# Patient Record
Sex: Female | Born: 1975 | Race: Black or African American | Hispanic: No | Marital: Single | Smoking: Never smoker
Health system: Southern US, Community
[De-identification: ages and names within clinical notes are randomized; demographics above are authoritative.]

## PROBLEM LIST (undated history)

## (undated) DIAGNOSIS — R200 Anesthesia of skin: Secondary | ICD-10-CM

## (undated) DIAGNOSIS — R202 Paresthesia of skin: Secondary | ICD-10-CM

## (undated) DIAGNOSIS — R000 Tachycardia, unspecified: Secondary | ICD-10-CM

## (undated) DIAGNOSIS — G43909 Migraine, unspecified, not intractable, without status migrainosus: Secondary | ICD-10-CM

## (undated) HISTORY — DX: Anesthesia of skin: R20.2

## (undated) HISTORY — DX: Migraine, unspecified, not intractable, without status migrainosus: G43.909

## (undated) HISTORY — DX: Anesthesia of skin: R20.0

## (undated) HISTORY — PX: ABDOMINAL HYSTERECTOMY: SHX81

---

## 2014-03-31 ENCOUNTER — Encounter (HOSPITAL_COMMUNITY): Payer: Self-pay | Admitting: Emergency Medicine

## 2014-03-31 ENCOUNTER — Emergency Department (HOSPITAL_COMMUNITY): Payer: 59

## 2014-03-31 ENCOUNTER — Emergency Department (HOSPITAL_COMMUNITY)
Admission: EM | Admit: 2014-03-31 | Discharge: 2014-04-01 | Disposition: A | Discharge status: Home / Self Care | Payer: 59 | Attending: Emergency Medicine | Admitting: Emergency Medicine

## 2014-03-31 DIAGNOSIS — Y9389 Activity, other specified: Secondary | ICD-10-CM | POA: Diagnosis not present

## 2014-03-31 DIAGNOSIS — S134XXA Sprain of ligaments of cervical spine, initial encounter: Secondary | ICD-10-CM | POA: Diagnosis not present

## 2014-03-31 DIAGNOSIS — S161XXA Strain of muscle, fascia and tendon at neck level, initial encounter: Secondary | ICD-10-CM

## 2014-03-31 DIAGNOSIS — Y9222 Religious institution as the place of occurrence of the external cause: Secondary | ICD-10-CM | POA: Insufficient documentation

## 2014-03-31 DIAGNOSIS — R51 Headache: Secondary | ICD-10-CM | POA: Diagnosis not present

## 2014-03-31 DIAGNOSIS — X58XXXA Exposure to other specified factors, initial encounter: Secondary | ICD-10-CM | POA: Diagnosis not present

## 2014-03-31 DIAGNOSIS — R200 Anesthesia of skin: Secondary | ICD-10-CM | POA: Diagnosis present

## 2014-03-31 DIAGNOSIS — R519 Headache, unspecified: Secondary | ICD-10-CM

## 2014-03-31 HISTORY — DX: Tachycardia, unspecified: R00.0

## 2014-03-31 LAB — I-STAT CHEM 8, ED
BUN: 8 mg/dL (ref 6–23)
CALCIUM ION: 1.24 mmol/L — AB (ref 1.12–1.23)
CREATININE: 0.8 mg/dL (ref 0.50–1.10)
Chloride: 106 mEq/L (ref 96–112)
GLUCOSE: 102 mg/dL — AB (ref 70–99)
HCT: 43 % (ref 36.0–46.0)
HEMOGLOBIN: 14.6 g/dL (ref 12.0–15.0)
Potassium: 3.6 mEq/L — ABNORMAL LOW (ref 3.7–5.3)
Sodium: 140 mEq/L (ref 137–147)
TCO2: 23 mmol/L (ref 0–100)

## 2014-03-31 MED ORDER — IBUPROFEN 200 MG PO TABS
400.0000 mg | ORAL_TABLET | Freq: Once | ORAL | Status: AC
  Administered 2014-03-31: 400 mg via ORAL
  Filled 2014-03-31: qty 2

## 2014-03-31 MED ORDER — METHOCARBAMOL 500 MG PO TABS
500.0000 mg | ORAL_TABLET | Freq: Once | ORAL | Status: AC
  Administered 2014-03-31: 500 mg via ORAL
  Filled 2014-03-31: qty 1

## 2014-03-31 NOTE — ED Notes (Signed)
Pt reports numbness/tingling in L face and L arm since 930am today. Reports that it has improved. Denies any other sx.

## 2014-03-31 NOTE — ED Provider Notes (Signed)
CSN: [id number]     Arrival date & time 03/31/14  2108 History   First MD Initiated Contact with Patient 03/31/14 2217     Chief Complaint  Patient presents with  . Numbness   (Consider location/radiation/quality/duration/timing/severity/associated sxs/prior Treatment) HPI Gina Arias is a 38 yo female presenting with report of "tightness" in her cheek, and soreness to the left side of her neck this am while going to church.  She reports it has been intermittent but she still feels it.  She also states she felt an ache in her left arm and and tingling in her fingers.  She denies any headache with these symptoms until tonight while at the ED. She denies any fevers, chest pain, shortness of breath, nausea, vomiting, changes in vision, or weakness.  Past Medical History  Diagnosis Date  . Tachycardia    Past Surgical History  Procedure Laterality Date  . Abdominal hysterectomy     No family history on file. History  Substance Use Topics  . Smoking status: Never Smoker   . Smokeless tobacco: Not on file  . Alcohol Use: No   OB History   Grav Para Term Preterm Abortions TAB SAB Ect Mult Living                 Review of Systems  Constitutional: Negative for fever and chills.  HENT: Negative for sore throat.   Eyes: Negative for visual disturbance.  Respiratory: Negative for cough and shortness of breath.   Cardiovascular: Negative for chest pain and leg swelling.  Gastrointestinal: Negative for nausea, vomiting and diarrhea.  Genitourinary: Negative for dysuria.  Musculoskeletal: Positive for neck pain. Negative for myalgias and neck stiffness.  Skin: Negative for rash.  Neurological: Positive for numbness. Negative for dizziness, facial asymmetry, speech difficulty, weakness and headaches.    Allergies  Review of patient's allergies indicates no known allergies.  Home Medications   Prior to Admission medications   Not on File   BP 129/74  Pulse 97  Temp(Src) 98.5  F (36.9 C) (Oral)  Resp 15  Ht 5\' 3"  (1.6 m)  Wt 126 lb (57.153 kg)  BMI 22.33 kg/m2  SpO2 100% Physical Exam  Nursing note and vitals reviewed. Constitutional: She is oriented to person, place, and time. She appears well-developed and well-nourished. No distress.  HENT:  Head: Normocephalic and atraumatic.  Mouth/Throat: Oropharynx is clear and moist. No oropharyngeal exudate.  Eyes: Conjunctivae are normal. Pupils are equal, round, and reactive to light.  Neck: Normal range of motion and full passive range of motion without pain. Neck supple. Muscular tenderness present. No rigidity. No thyromegaly present.    Cardiovascular: Normal rate, regular rhythm, S1 normal, S2 normal and intact distal pulses.  Exam reveals no gallop and no friction rub.   No murmur heard. Heart rate at time of exam was 88  Pulmonary/Chest: Effort normal and breath sounds normal. No respiratory distress. She has no wheezes. She has no rales. She exhibits no tenderness.  Abdominal: Soft. There is no tenderness.  Musculoskeletal: She exhibits tenderness.       Cervical back: She exhibits tenderness.       Back:  Lymphadenopathy:    She has no cervical adenopathy.  Neurological: She is alert and oriented to person, place, and time. She has normal strength. No cranial nerve deficit or sensory deficit. Coordination normal. GCS eye subscore is 4. GCS verbal subscore is 5. GCS motor subscore is 6.  Skin: Skin is warm and dry.  No rash noted. She is not diaphoretic.  Psychiatric: She has a normal mood and affect.    ED Course  Procedures (including critical care time) Labs Review Labs Reviewed  I-STAT CHEM 8, ED - Abnormal; Notable for the following:    Potassium 3.6 (*)    Glucose, Bld 102 (*)    Calcium, Ion 1.24 (*)    All other components within normal limits  CBG MONITORING, ED    Imaging Review Ct Head (brain) Wo Contrast  03/31/2014   CLINICAL DATA:  Initial encounter for numbness and tingling  in the left side of face and left arm beginning at 9:30 a.m. today.  EXAM: CT HEAD WITHOUT CONTRAST  TECHNIQUE: Contiguous axial images were obtained from the base of the skull through the vertex without intravenous contrast.  COMPARISON:  None.  FINDINGS: The cerebellar tonsils are low-lying and fill the foramen magnum. No acute infarct, hemorrhage, or mass lesion is present. The ventricles are of normal size. No significant extraaxial fluid collection is present.  A polyp or mucous retention cyst is present in the right maxillary sinus. A polyp or mucous retention cyst is present in the right sphenoid sinus. The left frontal sinus and anterior ethmoid air cell are opacified. The mastoid air cells are clear. The osseous skull is intact.  IMPRESSION: 1. Low lying cerebellar tonsils raises concern for a Chiari malformation. Recommend MRI of the brain without contrast for further evaluation. 2. No acute intracranial abnormality. 3. Mild scattered sinus disease.   Electronically Signed   By: Gennette Pachris  Mattern M.D.   On: 03/31/2014 22:23     EKG Interpretation   Date/Time:  Sunday March 31 2014 21:19:39 EDT Ventricular Rate:  93 PR Interval:  162 QRS Duration: 62 QT Interval:  338 QTC Calculation: 420 R Axis:   81 Text Interpretation:  Normal sinus rhythm Low voltage QRS Septal infarct ,  age undetermined Abnormal ECG ED PHYSICIAN INTERPRETATION AVAILABLE IN  [facility] Confirmed by TEST, Record ([id number]) on 04/02/2014 7:43:46 AM      MDM   Final diagnoses:  Cervical strain, initial encounter  Acute nonintractable headache, unspecified headache type   38 yo female presenting with intermittent cheek "tightness", left sided neck pain and left arm parasthesia onset this am.  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  I-stat 8, negative for significant abnormality.   Doubt acute neuro abnormality as cause of symptoms,  consider cervical strain woth radicular symptoms.  Will treat with muscle relaxant and pain meds and re-eval. Case discussed with Dr. Norlene Campbelltter. Pt with only minimal improvement after PO meds, PIV NS bolus and IV morphine given.  Pt reports resolution of pain.  Pt remains alert, with normal neuro exam, vital signs stable and in no acute distress.  Discharge instructions include symptomatic mgmt, and referral to neurology for follow-up regarding CT findings.  Pt aware of plan and in agreement.  Return precautions provided.   Filed Vitals:   03/31/14 2245 03/31/14 2300 03/31/14 2315 03/31/14 2354  BP: 136/78 126/70 127/75 135/65  Pulse: 86 92 90 83  Temp:    98.4 F (36.9 C)  TempSrc:    Oral  Resp: 16 15 13 17   Height:      Weight:      SpO2: 100% 99% 99% 98%   Meds given in ED:  Medications  morphine 4 MG/ML injection 4 mg (not administered)  ondansetron (ZOFRAN) injection 4 mg (not  administered)  methocarbamol (ROBAXIN) tablet 500 mg (500 mg Oral Given 03/31/14 2320)  ibuprofen (ADVIL,MOTRIN) tablet 400 mg (400 mg Oral Given 03/31/14 2320)    New Prescriptions   No medications on file       Harle Battiest, NP 04/04/14 2131

## 2014-04-01 LAB — CBG MONITORING, ED: Glucose-Capillary: 71 mg/dL (ref 70–99)

## 2014-04-01 MED ORDER — MORPHINE SULFATE 4 MG/ML IJ SOLN
4.0000 mg | Freq: Once | INTRAMUSCULAR | Status: AC
  Administered 2014-04-01: 4 mg via INTRAVENOUS
  Filled 2014-04-01: qty 1

## 2014-04-01 MED ORDER — SODIUM CHLORIDE 0.9 % IV BOLUS (SEPSIS)
1000.0000 mL | Freq: Once | INTRAVENOUS | Status: AC
  Administered 2014-04-01: 1000 mL via INTRAVENOUS

## 2014-04-01 MED ORDER — ONDANSETRON HCL 4 MG/2ML IJ SOLN
4.0000 mg | Freq: Once | INTRAMUSCULAR | Status: AC
  Administered 2014-04-01: 4 mg via INTRAVENOUS
  Filled 2014-04-01: qty 2

## 2014-04-01 MED ORDER — CYCLOBENZAPRINE HCL 10 MG PO TABS: 10.0000 mg | ORAL_TABLET | Freq: Two times a day (BID) | ORAL | Status: AC | PRN

## 2014-04-01 MED ORDER — HYDROCODONE-ACETAMINOPHEN 5-325 MG PO TABS: 1.0000 | ORAL_TABLET | Freq: Once | ORAL | Status: DC

## 2014-04-01 NOTE — Discharge Instructions (Signed)
Please follow the directions provided.  Be sure to follow-up with the referral provided regarding your incidental CT findings.   Your CT was negative for acute findings related to your symptoms tonight but there was an incidental finding suggestive of a Chiari malformation, that will need further evaluation from the neurologist.  Don't hesitate to return for any new, worsening or concerning symptoms.    SEEK IMMEDIATE MEDICAL CARE IF:  Your headache becomes severe.  You have a fever.  You have a stiff neck.  You have loss of vision.  You have muscular weakness or loss of muscle control.  You start losing your balance or have trouble walking.  You feel faint or pass out.  You have severe symptoms that are different from your first symptoms.

## 2014-04-02 ENCOUNTER — Ambulatory Visit: Payer: Self-pay | Admitting: Neurology

## 2014-04-05 NOTE — ED Provider Notes (Signed)
Medical screening examination/treatment/procedure(s) were conducted as a shared visit with non-physician practitioner(s) and myself.  I personally evaluated the patient during the encounter.   EKG Interpretation   Date/Time:  Sunday March 31 2014 21:19:39 EDT Ventricular Rate:  93 PR Interval:  162 QRS Duration: 62 QT Interval:  338 QTC Calculation: 420 R Axis:   81 Text Interpretation:  Normal sinus rhythm Low voltage QRS Septal infarct ,  age undetermined Abnormal ECG ED PHYSICIAN INTERPRETATION AVAILABLE IN  [facility] Confirmed by TEST, Record ([id number]) on 04/02/2014 7:43:46 AM     Pt with varying complaints. No signs of stroke, ACS.  Pt much improved with muscle relaxants.  Olivia Mackielga M Emylie Amster, MD 04/05/14 [phone]

## 2014-04-09 ENCOUNTER — Encounter: Payer: Self-pay | Admitting: Neurology

## 2014-04-09 ENCOUNTER — Ambulatory Visit (INDEPENDENT_AMBULATORY_CARE_PROVIDER_SITE_OTHER): Payer: 59 | Admitting: Neurology

## 2014-04-09 VITALS — BP 129/90 | HR 101 | Ht 64.0 in | Wt 130.5 lb

## 2014-04-09 DIAGNOSIS — G43909 Migraine, unspecified, not intractable, without status migrainosus: Secondary | ICD-10-CM | POA: Insufficient documentation

## 2014-04-09 DIAGNOSIS — R200 Anesthesia of skin: Secondary | ICD-10-CM | POA: Insufficient documentation

## 2014-04-09 DIAGNOSIS — G43019 Migraine without aura, intractable, without status migrainosus: Secondary | ICD-10-CM

## 2014-04-09 DIAGNOSIS — R202 Paresthesia of skin: Secondary | ICD-10-CM

## 2014-04-10 ENCOUNTER — Encounter: Payer: Self-pay | Admitting: Neurology

## 2014-04-10 ENCOUNTER — Telehealth: Payer: Self-pay | Admitting: Neurology

## 2014-04-10 NOTE — Telephone Encounter (Signed)
Annia FriendlySusanna, HR Manager for [employer], calling to request that a work note be sent to her describing any restrictions that patient may have or if she is okay to perform all work tasks, please return call and advise. Annia FriendlySusanna gave the secure fax to fax the letter which is: [phone].

## 2014-04-10 NOTE — Progress Notes (Signed)
PATIENT: Gina DecampRosalind Arias DOB: 27-Oct-1975  HISTORICAL  Gina Arias is a 38 years old right-handed female, referred by the emergency room for evaluation of abnormal CAT scan,  Over the past couple years, she has been feeling strength sensation in her head, but she was not able to elaborate on details, in October fourth 2015, she woke up, noticed numbness tingling involving her left jaw, left arm, later also developed frontal headache, pressure, with associated light noise sensitivity, lasting for a few hours, she presented to the emergency room, had CAT scan of the brain, which showed Arnold-Chiari malformation, otherwise no significant intracranial abnormality.  She reported few years history of frequent headaches, occasionally left-sided numbness, in between episode, she denies focal signs, laboratory showed no significant abnormality on CMP, CBC  REVIEW OF SYSTEMS: Full 14 system review of systems performed and notable only for blurry vision, ringing in ears, memory loss, confusion, headaches, numbness, dizziness  ALLERGIES: No Known Allergies  HOME MEDICATIONS: Current Outpatient Prescriptions on File Prior to Visit  Medication Sig Dispense Refill  . carvedilol (COREG) 3.125 MG tablet Take 3.125 mg by mouth 2 (two) times daily with a meal.      . Cholecalciferol (VITAMIN D PO) Take 1 tablet by mouth daily.      . cyclobenzaprine (FLEXERIL) 10 MG tablet Take 1 tablet (10 mg total) by mouth 2 (two) times daily as needed for muscle spasms.  20 tablet  0  . Pediatric Multiple Vitamins (CHEWABLE MULTIPLE VITAMINS PO) Take 1 tablet by mouth daily.       No current facility-administered medications on file prior to visit.    PAST MEDICAL HISTORY: Past Medical History  Diagnosis Date  . Tachycardia   . Migraines   . Numbness and tingling     PAST SURGICAL HISTORY: Past Surgical History  Procedure Laterality Date  . Abdominal hysterectomy      FAMILY HISTORY: Family  History  Problem Relation Age of Onset  . Sinusitis Mother     SOCIAL HISTORY:  History   Social History  . Marital Status: Single    Spouse Name: N/A    Number of Children: 0  . Years of Education: 12th   Occupational History  .     Social History Main Topics  . Smoking status: Never Smoker   . Smokeless tobacco: Not on file  . Alcohol Use: No  . Drug Use: No  . Sexual Activity: Not on file   Other Topics Concern  . Not on file   Social History Narrative   Patient lives at home with her sister.   Patient works full time.   Education high school education.   Right handed.     PHYSICAL EXAM   Filed Vitals:   04/09/14 0803  BP: 129/90  Pulse: 101  Height: 5\' 4"  (1.626 m)  Weight: 130 lb 8 oz (59.194 kg)    Not recorded    Body mass index is 22.39 kg/(m^2).   Generalized: In no acute distress  Neck: Supple, no carotid bruits   Cardiac: Regular rate rhythm  Pulmonary: Clear to auscultation bilaterally  Musculoskeletal: No deformity  Neurological examination  Mentation: Alert oriented to time, place, history taking, and causual conversation  Cranial nerve II-XII: Pupils were equal round reactive to light. Extraocular movements were full.  Visual field were full on confrontational test. Bilateral fundi were sharp.  Facial sensation and strength were normal. Hearing was intact to finger rubbing bilaterally. Uvula tongue midline.  Head turning and shoulder shrug and were normal and symmetric.Tongue protrusion into cheek strength was normal.  Motor: Normal tone, bulk and strength.  Sensory: Intact to fine touch, pinprick, preserved vibratory sensation, and proprioception at toes.  Coordination: Normal finger to nose, heel-to-shin bilaterally there was no truncal ataxia  Gait: Rising up from seated position without assistance, normal stance, without trunk ataxia, moderate stride, good arm swing, smooth turning, able to perform tiptoe, and heel walking  without difficulty.   Romberg signs: Negative  Deep tendon reflexes: Brachioradialis 2/2, biceps 2/2, triceps 2/2, patellar 2/2, Achilles 2/2, plantar responses were flexor bilaterally.   DIAGNOSTIC DATA (LABS, IMAGING, TESTING) - I reviewed patient records, labs, notes, testing and imaging myself where available.  Lab Results  Component Value Date   HGB 14.6 03/31/2014   HCT 43.0 03/31/2014      Component Value Date/Time   NA 140 03/31/2014 2145   K 3.6* 03/31/2014 2145   CL 106 03/31/2014 2145   GLUCOSE 102* 03/31/2014 2145   BUN 8 03/31/2014 2145   CREATININE 0.80 03/31/2014 2145     ASSESSMENT AND PLAN  Gina Arias is a 38 y.o. female presented with left-sided numbness, with associated headaches, most consistent with complicated migraines, incidental finding of Arnold-Chiari malformation,  1, complete evaluation with MRI of the brain  2. NSAIDs as needed for headaches  3. Return to clinic in 3 weeks  Levert FeinsteinYijun Camil Wilhelmsen, M.D. Ph.D.  [facility] [address], [address] [place], [place] [address] [phone]

## 2014-04-12 ENCOUNTER — Ambulatory Visit: Payer: Self-pay | Admitting: Neurology

## 2014-04-12 NOTE — Telephone Encounter (Signed)
Gina Arias, note generated, please fax.

## 2014-04-15 NOTE — Telephone Encounter (Signed)
Called patient and let her no that I would fax note 04/16/2014.

## 2014-04-16 NOTE — Telephone Encounter (Signed)
Faxed to [place] [phone]

## 2014-04-16 NOTE — Telephone Encounter (Signed)
Gina ChessmanSuzanne with [employer] @ [phone], hadn't received return back to work note as of 11:16 am.  Please call and advise.

## 2014-04-30 ENCOUNTER — Ambulatory Visit: Payer: 59 | Admitting: Neurology

## 2014-07-17 ENCOUNTER — Ambulatory Visit (INDEPENDENT_AMBULATORY_CARE_PROVIDER_SITE_OTHER): Payer: 59

## 2014-07-17 DIAGNOSIS — G43019 Migraine without aura, intractable, without status migrainosus: Secondary | ICD-10-CM

## 2014-07-17 DIAGNOSIS — R202 Paresthesia of skin: Secondary | ICD-10-CM

## 2014-07-17 DIAGNOSIS — R200 Anesthesia of skin: Secondary | ICD-10-CM

## 2014-07-22 ENCOUNTER — Telehealth: Payer: Self-pay | Admitting: Neurology

## 2014-07-22 NOTE — Telephone Encounter (Signed)
Pt verbalized understanding of MRI results.  Reported improvement in left-sided numbness and frequency of headaches.  Still having intermittent dizziness.  She will call back to scheduled follow up appointment when she has her work schedule available.

## 2014-07-22 NOTE — Telephone Encounter (Signed)
Michelle:  Please call patient, MRI of the brain showed normal variations, if she continue complaining of localized symptoms, give her a follow-up appointment with Eber Jonesarolyn, or with me in 1-2 months   Equivocal MRI brain (without) demonstrating: 1. 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 2. Remainder of brain is unremarkable.

## 2014-07-30 ENCOUNTER — Telehealth: Payer: Self-pay | Admitting: *Deleted

## 2014-07-30 NOTE — Telephone Encounter (Signed)
Patient called and scheduled a follow up appt. Patient is also wanting another letter written for her job about her do's and don't's . Patient last received a letter  last year.

## 2014-07-30 NOTE — Telephone Encounter (Signed)
Attempted call to patient - home # no longer in service - left a message on her cell #[phone] for her to return my call.

## 2014-07-30 NOTE — Telephone Encounter (Signed)
Spoke to patient - she is aware that she will need to come in for an appt prior to Dr. Terrace ArabiaYan providing a new letter for her employer - she was agreeable to this plan.

## 2014-07-30 NOTE — Telephone Encounter (Signed)
Attempted call to patient

## 2014-08-13 ENCOUNTER — Encounter: Payer: Self-pay | Admitting: Nurse Practitioner

## 2014-08-13 ENCOUNTER — Ambulatory Visit (INDEPENDENT_AMBULATORY_CARE_PROVIDER_SITE_OTHER): Payer: 59 | Admitting: Nurse Practitioner

## 2014-08-13 ENCOUNTER — Telehealth: Payer: Self-pay | Admitting: Nurse Practitioner

## 2014-08-13 VITALS — BP 139/89 | HR 86 | Temp 97.0°F | Ht 61.0 in | Wt 131.0 lb

## 2014-08-13 DIAGNOSIS — G43009 Migraine without aura, not intractable, without status migrainosus: Secondary | ICD-10-CM

## 2014-08-13 DIAGNOSIS — R202 Paresthesia of skin: Secondary | ICD-10-CM

## 2014-08-13 DIAGNOSIS — R200 Anesthesia of skin: Secondary | ICD-10-CM

## 2014-08-13 MED ORDER — TOPIRAMATE 25 MG PO TABS: ORAL_TABLET | ORAL | Status: DC

## 2014-08-13 NOTE — Telephone Encounter (Signed)
She needs a brief letter saying she can't lift no more than 15 lbs at work from [employer]. Her best number to contact her is [phone]

## 2014-08-13 NOTE — Progress Notes (Signed)
[facility]  PATIENT: Gina Arias DOB: 1976/04/04   REASON FOR VISIT: follow-up for headaches HISTORY FROM:Patient    HISTORY OF PRESENT ILLNESS:Ms.  Gina Arias is a 39 years old right-handed female, last seen in this office by Dr. Terrace ArabiaYan 04/09/2014 for evaluation of abnormal CAT scan. Over the past couple years, she has been feeling strength sensation in her head, but she was not able to elaborate on details, in March 31, 2014, she woke up, noticed numbness tingling involving her left jaw, left arm, later also developed frontal headache, pressure, with associated light noise sensitivity, lasting for a few hours, she presented to the emergency room, had CAT scan of the brain, which showed Arnold-Chiari malformation, otherwise no significant intracranial abnormality. She reported few years history of frequent headaches, occasionally left-sided numbness, in between episode, she denies focal signs, laboratory showed no significant abnormality on CMP, CBC. She tells me today she used to be on Topamax when [place] in [place] which worked well for her migraines. [place]2 years and has not been on that medication since being here. She takes a lot of Motrin which is probably causing some rebound. Reviewed MRI of the brain after last visit which shows mild cerebellar tonsillar ectopia of 4 mm below foremen magnum. Remainder of the brain is unremarkable and answered patient's questions regarding this. She continues to have several headaches a week. She is not aware of any migraine triggers. She returns for reevaluation. REVIEW OF SYSTEMS: Full 14 system review of systems performed and notable only for those listed, all others are neg:  Constitutional: neg  Cardiovascular: neg Ear/Nose/Throat: neg  Skin: neg Eyes: neg Respiratory: neg Gastroitestinal: neg  Hematology/Lymphatic: neg  Endocrine: neg Musculoskeletal:neck pain, low back  pain Allergy/Immunology: neg Neurological: headache, Occasional numbness Psychiatric: neg Sleep : neg   ALLERGIES: No Known Allergies  HOME MEDICATIONS: Outpatient Prescriptions Prior to Visit  Medication Sig Dispense Refill  . carvedilol (COREG) 3.125 MG tablet Take 3.125 mg by mouth 2 (two) times daily with a meal.    . cyclobenzaprine (FLEXERIL) 10 MG tablet Take 1 tablet (10 mg total) by mouth 2 (two) times daily as needed for muscle spasms. 20 tablet 0  . Pediatric Multiple Vitamins (CHEWABLE MULTIPLE VITAMINS PO) Take 1 tablet by mouth daily.    . Cholecalciferol (VITAMIN D PO) Take 1 tablet by mouth daily.     No facility-administered medications prior to visit.    PAST MEDICAL HISTORY: Past Medical History  Diagnosis Date  . Tachycardia   . Migraines   . Numbness and tingling     PAST SURGICAL HISTORY: Past Surgical History  Procedure Laterality Date  . Abdominal hysterectomy      FAMILY HISTORY: Family History  Problem Relation Age of Onset  . Sinusitis Mother     SOCIAL HISTORY: History   Social History  . Marital Status: Single    Spouse Name: N/A  . Number of Children: 0  . Years of Education: 12th   Occupational History  .     Social History Main Topics  . Smoking status: Never Smoker   . Smokeless tobacco: Not on file  . Alcohol Use: No  . Drug Use: No  . Sexual Activity: Not on file   Other Topics Concern  . Not on file   Social History Narrative   Patient lives at home with her sister.   Patient works full time.   Education high school  education.   Right handed.     PHYSICAL EXAM  Filed Vitals:   08/13/14 1010  BP: 139/89  Pulse: 86  Temp: 97 F (36.1 C)  TempSrc: Oral  Height:  (1.549 m)  Weight: 131 lb (59.421 kg)   Body mass index is 24.76 kg/(m^2).  Generalized: Well developed, in no acute distress  Head: normocephalic and atraumatic,. Oropharynx benign  Neck: Supple, no carotid bruits  Cardiac: Regular  rate rhythm, no murmur  Musculoskeletal: No deformity   Neurological examination   Mentation: Alert oriented to time, place, history taking. Attention span and concentration appropriate. Recent and remote memory intact.  Follows all commands speech and language fluent.   Cranial nerve II-XII: Fundoscopic exam reveals sharp disc margins.Pupils were equal round reactive to light extraocular movements were full, visual field were full on confrontational test. Facial sensation and strength were normal. hearing was intact to finger rubbing bilaterally. Uvula tongue midline. head turning and shoulder shrug were normal and symmetric.Tongue protrusion into cheek strength was normal. Motor: normal bulk and tone, full strength in the BUE, BLE, fine finger movements normal, no pronator drift. No focal weakness Sensory: normal and symmetric to light touch, pinprick, and  Vibration, proprioception  Coordination: finger-nose-finger, heel-to-shin bilaterally, no dysmetria Reflexes: Brachioradialis 2/2, biceps 2/2, triceps 2/2, patellar 2/2, Achilles 2/2, plantar responses were flexor bilaterally. Gait and Station: Rising up from seated position without assistance, normal stance,  moderate stride, good arm swing, smooth turning, able to perform tiptoe, and heel walking without difficulty. Tandem gait is steady  DIAGNOSTIC DATA (LABS, IMAGING, TESTING) - I reviewed patient records, labs, notes, testing and imaging myself where available.  Lab Results  Component Value Date   HGB 14.6 03/31/2014   HCT 43.0 03/31/2014      Component Value Date/Time   NA 140 03/31/2014 2145   K 3.6* 03/31/2014 2145   CL 106 03/31/2014 2145   GLUCOSE 102* 03/31/2014 2145   BUN 8 03/31/2014 2145   CREATININE 0.80 03/31/2014 2145    ASSESSMENT AND PLAN  39 y.o. year old female  has a past medical history of Tachycardia; Migraines; and Numbness and tingling. here to follow-up. She is having several headaches per week and is  not on any preventive. She is taking a lot of Motrin which may be causing rebound.Reviewed MRI of the brain after last visit which shows mild cerebellar tonsillar ectopia of 4 mm below foremen magnum. Remainder of the brain is unremarkable .   Will begin Topamax 25 mg at bedtime for 1 week then increase to 50 mg at bedtime RX to patient per request  given list of migraine triggers and reviewed these with the patient   she will keep a diary of her headaches and return in 2 months with her diary   limit Motrin   follow-up in 2 months Nilda Riggs, [facility], [facility], APRN  [facility] [address] [place], [place] [address] [phone]

## 2014-08-13 NOTE — Patient Instructions (Signed)
Will begin Topamax 25 mg at bedtime for 1 week then increase to 50 mg at bedtime   given list of migraine triggers and reviewed these with the patient   she will keep a diary of her headaches and return in 2 months with her diary   limit Motrin   follow-up in 2 months

## 2014-08-13 NOTE — Telephone Encounter (Addendum)
Spoke to patient. Faxed letter to place of employment as requested. Patient was grateful.

## 2014-08-21 NOTE — Progress Notes (Signed)
I have reviewed and agreed above plan. 

## 2014-10-07 ENCOUNTER — Ambulatory Visit: Payer: Self-pay | Admitting: Nurse Practitioner

## 2014-10-11 ENCOUNTER — Ambulatory Visit: Payer: 59 | Admitting: Nurse Practitioner

## 2015-03-11 ENCOUNTER — Telehealth: Payer: Self-pay | Admitting: Neurology

## 2015-03-11 MED ORDER — TOPIRAMATE 25 MG PO TABS: 50.0000 mg | ORAL_TABLET | Freq: Every evening | ORAL | Status: AC

## 2015-03-11 NOTE — Telephone Encounter (Signed)
Patient called requesting refill for topiramate (TOPAMAX) 25 MG tablet .  Please send to [facility] on [address]

## 2015-03-11 NOTE — Telephone Encounter (Signed)
Refill has been sent.  Receipt confirmed by pharmacy.  

## 2015-04-29 ENCOUNTER — Emergency Department (HOSPITAL_COMMUNITY)
Admission: EM | Admit: 2015-04-29 | Discharge: 2015-04-29 | Disposition: A | Discharge status: Home / Self Care | Payer: 59 | Attending: Emergency Medicine | Admitting: Emergency Medicine

## 2015-04-29 ENCOUNTER — Encounter (HOSPITAL_COMMUNITY): Payer: Self-pay | Admitting: *Deleted

## 2015-04-29 DIAGNOSIS — R195 Other fecal abnormalities: Secondary | ICD-10-CM

## 2015-04-29 DIAGNOSIS — Z79899 Other long term (current) drug therapy: Secondary | ICD-10-CM | POA: Insufficient documentation

## 2015-04-29 DIAGNOSIS — R197 Diarrhea, unspecified: Secondary | ICD-10-CM | POA: Diagnosis present

## 2015-04-29 DIAGNOSIS — G43909 Migraine, unspecified, not intractable, without status migrainosus: Secondary | ICD-10-CM | POA: Diagnosis not present

## 2015-04-29 LAB — CBC WITH DIFFERENTIAL/PLATELET
BASOS ABS: 0 10*3/uL (ref 0.0–0.1)
Basophils Relative: 0 %
Eosinophils Absolute: 0.1 10*3/uL (ref 0.0–0.7)
Eosinophils Relative: 2 %
HEMATOCRIT: 43.3 % (ref 36.0–46.0)
HEMOGLOBIN: 14.2 g/dL (ref 12.0–15.0)
LYMPHS PCT: 13 %
Lymphs Abs: 0.6 10*3/uL — ABNORMAL LOW (ref 0.7–4.0)
MCH: 28.7 pg (ref 26.0–34.0)
MCHC: 32.8 g/dL (ref 30.0–36.0)
MCV: 87.5 fL (ref 78.0–100.0)
MONO ABS: 0.2 10*3/uL (ref 0.1–1.0)
Monocytes Relative: 3 %
NEUTROS ABS: 3.7 10*3/uL (ref 1.7–7.7)
Neutrophils Relative %: 82 %
Platelets: 255 10*3/uL (ref 150–400)
RBC: 4.95 MIL/uL (ref 3.87–5.11)
RDW: 12.3 % (ref 11.5–15.5)
WBC: 4.6 10*3/uL (ref 4.0–10.5)

## 2015-04-29 LAB — COMPREHENSIVE METABOLIC PANEL
ALK PHOS: 41 U/L (ref 38–126)
ALT: 20 U/L (ref 14–54)
AST: 25 U/L (ref 15–41)
Albumin: 4.1 g/dL (ref 3.5–5.0)
Anion gap: 7 (ref 5–15)
BILIRUBIN TOTAL: 0.6 mg/dL (ref 0.3–1.2)
BUN: 10 mg/dL (ref 6–20)
CALCIUM: 9.8 mg/dL (ref 8.9–10.3)
CO2: 23 mmol/L (ref 22–32)
CREATININE: 0.78 mg/dL (ref 0.44–1.00)
Chloride: 111 mmol/L (ref 101–111)
GFR calc Af Amer: >60 mL/min (ref >60)
Glucose, Bld: 104 mg/dL — ABNORMAL HIGH (ref 65–99)
Potassium: 4.2 mmol/L (ref 3.5–5.1)
Sodium: 141 mmol/L (ref 135–145)
Total Protein: 6.8 g/dL (ref 6.5–8.1)

## 2015-04-29 LAB — LIPASE, BLOOD: LIPASE: 34 U/L (ref 11–51)

## 2015-04-29 NOTE — ED Notes (Signed)
Pt states she has been having loud belly sounds with diarrhea x2 weeks. Pt also states she is having white foul smelling d/c. Pt denies pain.

## 2015-04-29 NOTE — ED Notes (Signed)
Pt is in stable condition upon d/c and ambulates from ED. 

## 2015-04-29 NOTE — ED Provider Notes (Signed)
CSN: [id number]     Arrival date & time 04/29/15  0902 History   First MD Initiated Contact with Patient 04/29/15 [phone]     Chief Complaint  Patient presents with  . Diarrhea     (Consider location/radiation/quality/duration/timing/severity/associated sxs/prior Treatment) Patient is a 39 y.o. female presenting with diarrhea.  Diarrhea Quality:  Watery ("and like grains") Severity:  Moderate Onset quality:  Gradual Number of episodes:  2 episodes per day for 4 weeks Duration:  4 weeks Timing:  Constant Progression:  Worsening Relieved by:  Nothing Worsened by:  Nothing tried Ineffective treatments:  None tried Associated symptoms: no abdominal pain, no recent cough, no fever, no headaches, no myalgias, no URI and no vomiting   Risk factors: no recent antibiotic use, no sick contacts and no suspicious food intake     Past Medical History  Diagnosis Date  . Tachycardia   . Migraines   . Numbness and tingling    Past Surgical History  Procedure Laterality Date  . Abdominal hysterectomy     Family History  Problem Relation Age of Onset  . Sinusitis Mother    Social History  Substance Use Topics  . Smoking status: Never Smoker   . Smokeless tobacco: None  . Alcohol Use: No   OB History    No data available     Review of Systems  Constitutional: Negative for fever.  HENT: Negative for sore throat.   Eyes: Negative for visual disturbance.  Respiratory: Negative for cough and shortness of breath.   Cardiovascular: Negative for chest pain.  Gastrointestinal: Positive for diarrhea. Negative for vomiting and abdominal pain.  Genitourinary: Negative for dysuria, vaginal discharge (chronic unchanged vaginal discharge since hysterectomy, 2 years ago, unchanged, has seen OBGYN for it) and difficulty urinating.  Musculoskeletal: Negative for myalgias, back pain and neck pain.  Skin: Negative for rash.  Neurological: Negative for syncope and headaches.      Allergies   Review of patient's allergies indicates no known allergies.  Home Medications   Prior to Admission medications   Medication Sig Start Date End Date Taking? Authorizing Provider  carvedilol (COREG) 3.125 MG tablet Take 3.125 mg by mouth 2 (two) times daily with a meal.   Yes Historical Provider, MD  Cholecalciferol (VITAMIN D PO) Take 1 tablet by mouth daily.   Yes Historical Provider, MD  Pediatric Multiple Vitamins (CHEWABLE MULTIPLE VITAMINS PO) Take 1 tablet by mouth daily.   Yes Historical Provider, MD  topiramate (TOPAMAX) 25 MG tablet Take 2 tablets (50 mg total) by mouth Nightly. 03/11/15  Yes Levert FeinsteinYijun Yan, MD  VITAMIN E PO Take by mouth.   Yes Historical Provider, MD  cyclobenzaprine (FLEXERIL) 10 MG tablet Take 1 tablet (10 mg total) by mouth 2 (two) times daily as needed for muscle spasms. Patient not taking: Reported on 04/29/2015 04/01/14   Harle BattiestElizabeth Tysinger, NP   BP 125/82 mmHg  Pulse 89  Temp(Src) 97.7 F (36.5 C) (Oral)  Resp 16  Ht 5\' 3"  (1.6 m)  Wt 117 lb (53.071 kg)  BMI 20.73 kg/m2  SpO2 100% Physical Exam  Constitutional: She is oriented to person, place, and time. She appears well-developed and well-nourished. No distress.  HENT:  Head: Normocephalic and atraumatic.  Eyes: Conjunctivae and EOM are normal.  Neck: Normal range of motion.  Cardiovascular: Normal rate, regular rhythm, normal heart sounds and intact distal pulses.  Exam reveals no gallop and no friction rub.   No murmur heard. Pulmonary/Chest: Effort normal and  breath sounds normal. No respiratory distress. She has no wheezes. She has no rales.  Abdominal: Soft. She exhibits no distension. There is no tenderness. There is no guarding.  Musculoskeletal: She exhibits no edema or tenderness.  Neurological: She is alert and oriented to person, place, and time.  Skin: Skin is warm and dry. No rash noted. She is not diaphoretic. No erythema.  Nursing note and vitals reviewed.   ED Course  Procedures  (including critical care time) Labs Review Labs Reviewed  CBC WITH DIFFERENTIAL/PLATELET - Abnormal; Notable for the following:    Lymphs Abs 0.6 (*)    All other components within normal limits  COMPREHENSIVE METABOLIC PANEL - Abnormal; Notable for the following:    Glucose, Bld 104 (*)    All other components within normal limits  LIPASE, BLOOD    Imaging Review No results found. I have personally reviewed and evaluated these images and lab results as part of my medical decision-making.   EKG Interpretation None      MDM   Final diagnoses:  Loose stools   39 year old female with a history of migraines, abdominal hysterectomy 2 years ago, presents with concern of 4 weeks of loud abdominal sounds, 2 loose stool a day, and weight loss.  Patient has no abdominal pain, and have low suspicion for appendicitis, cholecystitis, diverticulitis, nephrolithiasis or py elonephritis. Vaginal discharge is chronic and unchanged since her hysterectomy.  Patient is well-appearing, well-hydrated.    Checked CBC, CMP and lipase without acute findings.  Recommend close PCP follow-up as well as evaluation by gastroenterologist given loose stools and unintentional weight loss.      Alvira Monday, MD 04/29/15 [phone]

## 2016-05-08 IMAGING — CT CT HEAD W/O CM
2 series · 15 of 30 positions shown, 19 images · non-contrast
Comparison: None.

CLINICAL DATA: Initial encounter for numbness and tingling in the
left side of face and left arm beginning at [DATE] a.m. today.

EXAM:
CT HEAD WITHOUT CONTRAST
TECHNIQUE: Contiguous axial images were obtained from the base of the skull
through the vertex without intravenous contrast.

[Series 201: head w/o, idose (1) · axial · non-contrast · 0.40mm/px · z∈[+54,+174]mm · 13 of 29 slices shown, 17 images]
[im 3/29  brain]
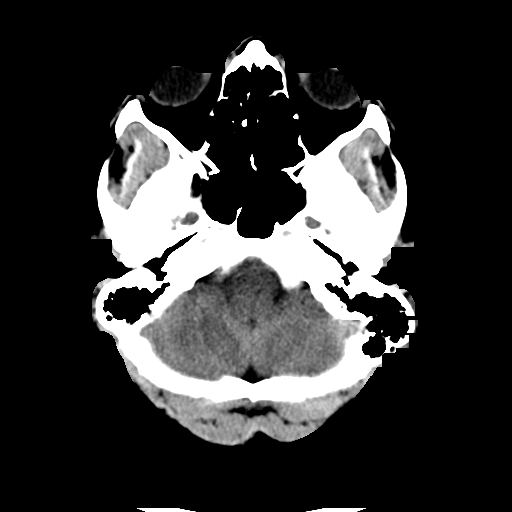
[im 3/29  bone]
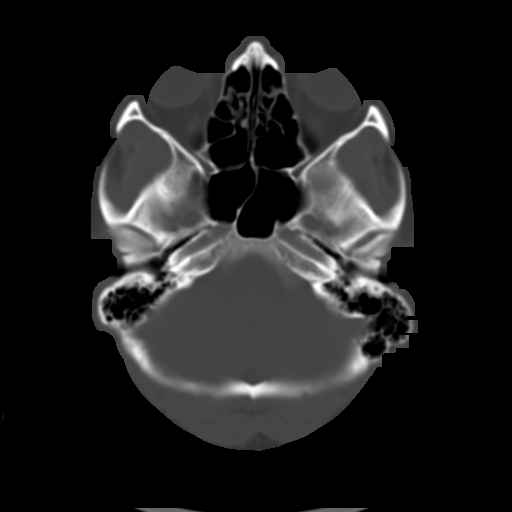
[im 5/29  brain]
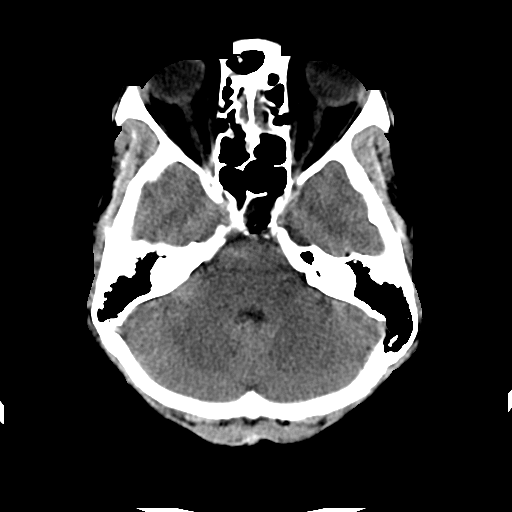
[im 7/29  brain]
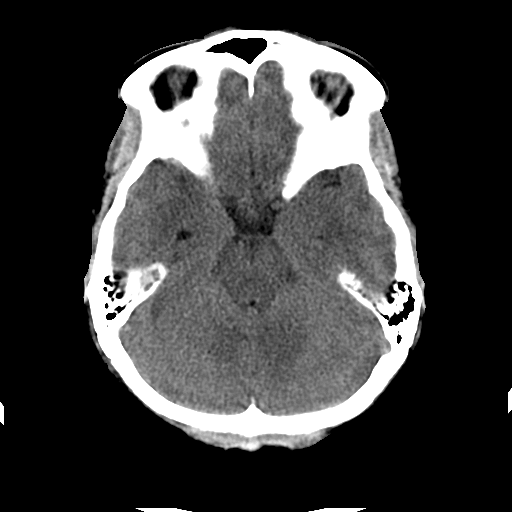
[im 9/29  brain]
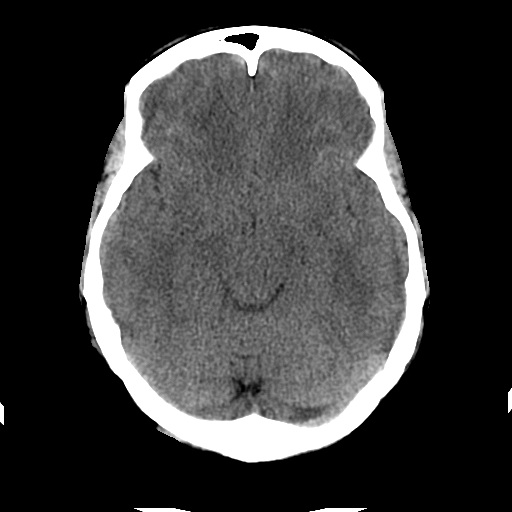
[im 11/29  brain]
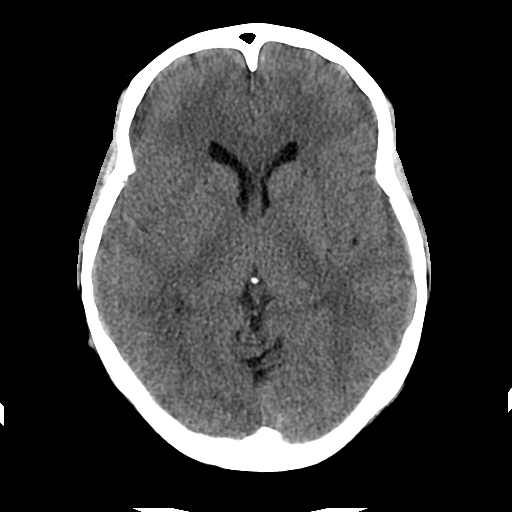
[im 11/29  bone]
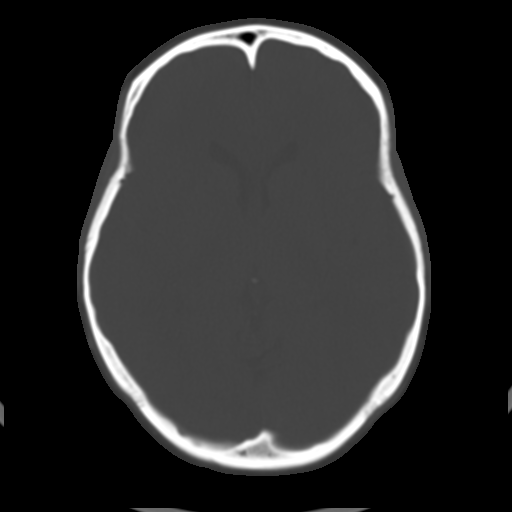
[im 13/29  brain]
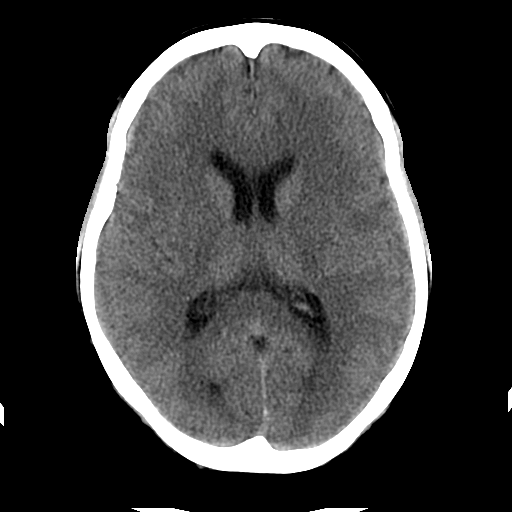
[im 15/29  brain]
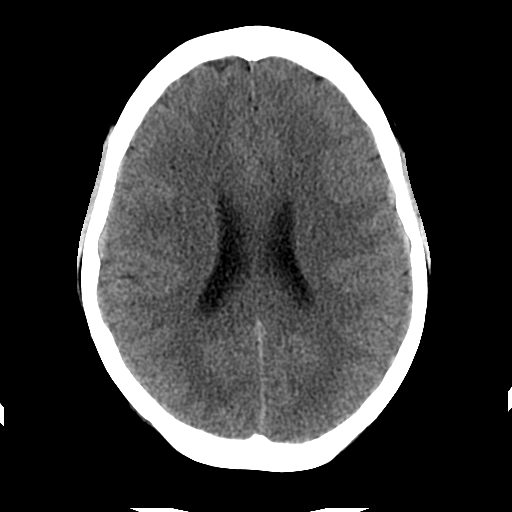
[im 17/29  brain]
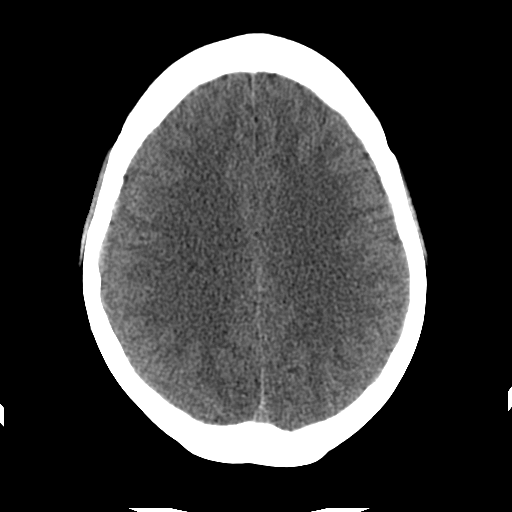
[im 19/29  brain]
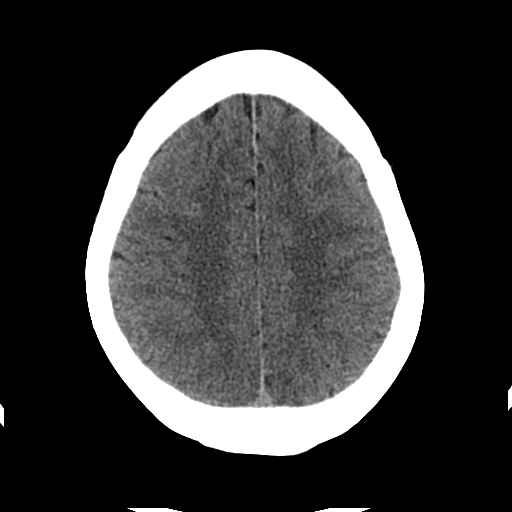
[im 19/29  bone]
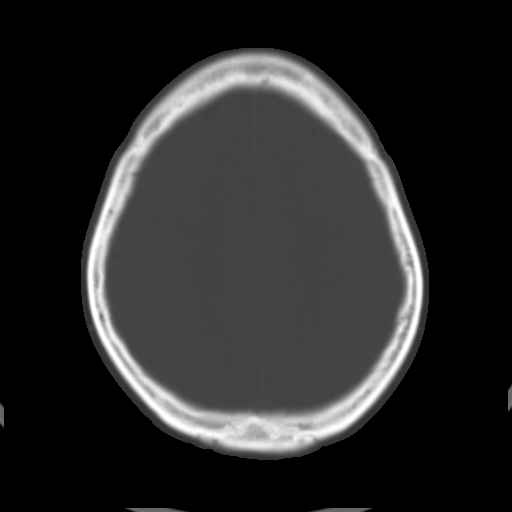
[im 21/29  brain]
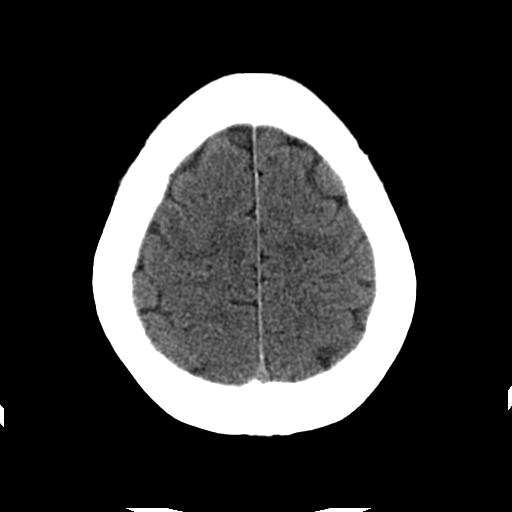
[im 23/29  brain]
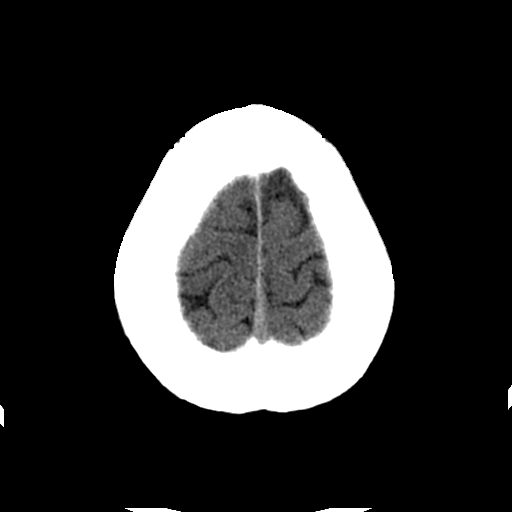
[im 25/29  brain]
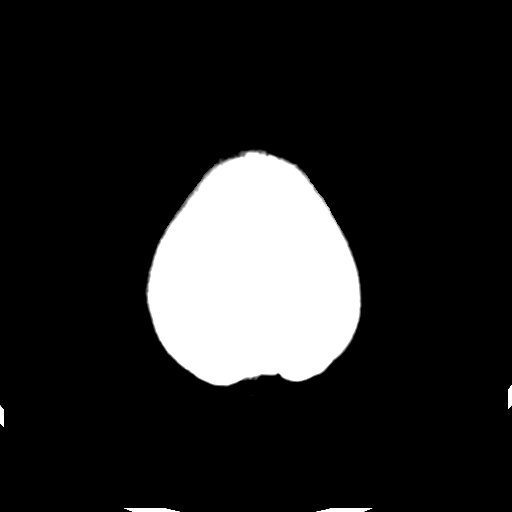
[im 27/29  brain]
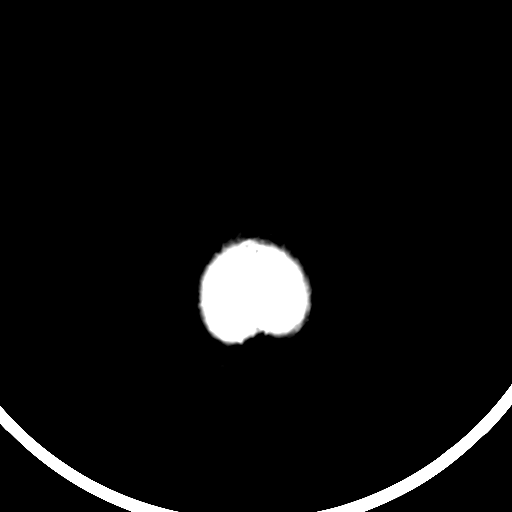
[im 27/29  bone]
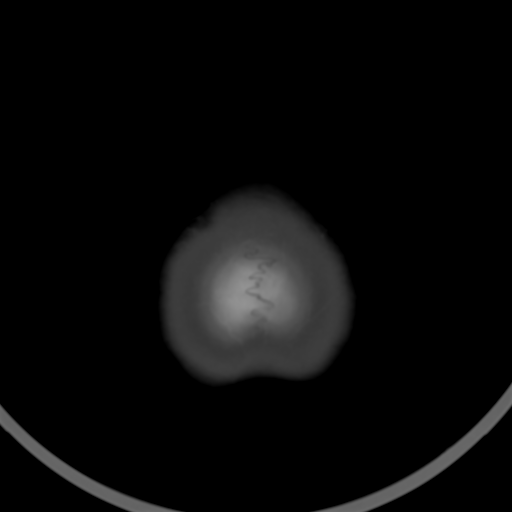

[Series 202: head w/o bone, idose (1) · axial · non-contrast · 0.40mm/px · z∈[+54,+74]mm · 2 of 29 slices shown]
[im 3/29  bone]
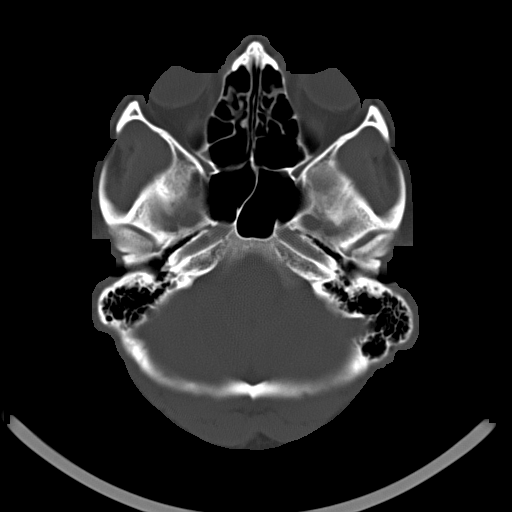
[im 7/29  bone]
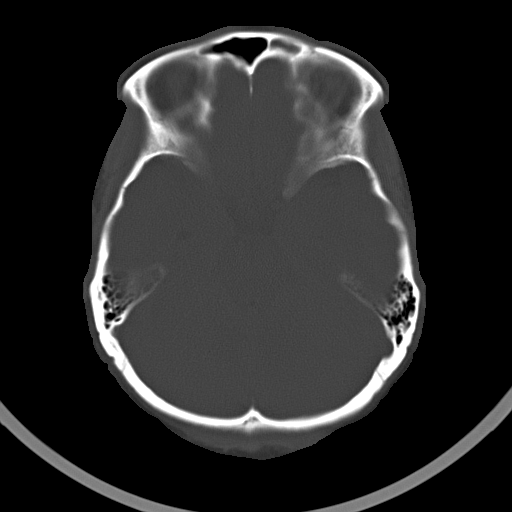

[15 of 30 positions shown; findings below may reference images not displayed]

FINDINGS: The cerebellar tonsils are low-lying and fill the foramen magnum. No
acute infarct, hemorrhage, or mass lesion is present. The ventricles
are of normal size. No significant extraaxial fluid collection is
present.

A polyp or mucous retention cyst is present in the right maxillary
sinus. A polyp or mucous retention cyst is present in the right
sphenoid sinus. The left frontal sinus and anterior ethmoid air cell
are opacified. The mastoid air cells are clear. The osseous skull is
intact.
IMPRESSION: 1. Low lying cerebellar tonsils raises concern for a Chiari
malformation. Recommend MRI of the brain without contrast for
further evaluation.
2. No acute intracranial abnormality.
3. Mild scattered sinus disease.

## 2018-09-06 ENCOUNTER — Other Ambulatory Visit: Payer: Self-pay

## 2018-09-06 ENCOUNTER — Encounter (HOSPITAL_COMMUNITY): Payer: Self-pay

## 2018-09-06 ENCOUNTER — Emergency Department (HOSPITAL_COMMUNITY)
Admission: EM | Admit: 2018-09-06 | Discharge: 2018-09-06 | Disposition: A | Discharge status: Home / Self Care | Payer: Self-pay | Attending: Emergency Medicine | Admitting: Emergency Medicine

## 2018-09-06 ENCOUNTER — Emergency Department (HOSPITAL_COMMUNITY): Payer: Self-pay

## 2018-09-06 DIAGNOSIS — R000 Tachycardia, unspecified: Secondary | ICD-10-CM | POA: Insufficient documentation

## 2018-09-06 DIAGNOSIS — Z79899 Other long term (current) drug therapy: Secondary | ICD-10-CM | POA: Insufficient documentation

## 2018-09-06 LAB — TROPONIN I: Troponin I: <0.03 ng/mL (ref <0.03)

## 2018-09-06 LAB — CBC
HCT: 47 % — ABNORMAL HIGH (ref 36.0–46.0)
Hemoglobin: 14.6 g/dL (ref 12.0–15.0)
MCH: 28.5 pg (ref 26.0–34.0)
MCHC: 31.1 g/dL (ref 30.0–36.0)
MCV: 91.6 fL (ref 80.0–100.0)
Platelets: 250 10*3/uL (ref 150–400)
RBC: 5.13 MIL/uL — AB (ref 3.87–5.11)
RDW: 11.7 % (ref 11.5–15.5)
WBC: 3.8 10*3/uL — ABNORMAL LOW (ref 4.0–10.5)
nRBC: 0 % (ref 0.0–0.2)

## 2018-09-06 LAB — BASIC METABOLIC PANEL
ANION GAP: 9 (ref 5–15)
BUN: 14 mg/dL (ref 6–20)
CO2: 25 mmol/L (ref 22–32)
Calcium: 9.9 mg/dL (ref 8.9–10.3)
Chloride: 106 mmol/L (ref 98–111)
Creatinine, Ser: 0.95 mg/dL (ref 0.44–1.00)
GFR calc Af Amer: >60 mL/min (ref >60)
GFR calc non Af Amer: >60 mL/min (ref >60)
Glucose, Bld: 108 mg/dL — ABNORMAL HIGH (ref 70–99)
Potassium: 4.1 mmol/L (ref 3.5–5.1)
Sodium: 140 mmol/L (ref 135–145)

## 2018-09-06 LAB — I-STAT BETA HCG BLOOD, ED (NOT ORDERABLE): I-stat hCG, quantitative: <5 m[IU]/mL (ref <5)

## 2018-09-06 LAB — TSH: TSH: 1.049 u[IU]/mL (ref 0.350–4.500)

## 2018-09-06 MED ORDER — SODIUM CHLORIDE 0.9% FLUSH: 3.0000 mL | Freq: Once | INTRAVENOUS | Status: DC

## 2018-09-06 MED ORDER — METOPROLOL SUCCINATE ER 25 MG PO TB24: 12.5000 mg | ORAL_TABLET | Freq: Every day | ORAL | 0 refills | Status: AC

## 2018-09-06 MED ORDER — METOPROLOL TARTRATE 5 MG/5ML IV SOLN
5.0000 mg | Freq: Once | INTRAVENOUS | Status: AC
  Administered 2018-09-06: 5 mg via INTRAVENOUS
  Filled 2018-09-06: qty 5

## 2018-09-06 NOTE — ED Provider Notes (Signed)
[facility]-EMERGENCY DEPT Provider Note   CSN: [id number] Arrival date & time: 09/06/18  1209    History   Chief Complaint Chief Complaint  Patient presents with  . Tachycardia    HPI Gina Arias is a 43 y.o. female.     Pt presents to the ED today with fast heart rate.  She has a hx of tachycardia for which she was on Coreg 3.125 mg.  She lost her insurance, so has not been back to the doctor since 2017.  She has not taken her Coreg for about 1 year.  She said her HR normally runs 115-120.  She said it was going about 140s this morning.  She denies cp or sob.     Past Medical History:  Diagnosis Date  . Migraines   . Numbness and tingling   . Tachycardia     Patient Active Problem List   Diagnosis Date Noted  . Migraines   . Numbness and tingling     Past Surgical History:  Procedure Laterality Date  . ABDOMINAL HYSTERECTOMY       OB History   No obstetric history on file.      Home Medications    Prior to Admission medications   Medication Sig Start Date End Date Taking? Authorizing Provider  Cholecalciferol (VITAMIN D PO) Take 1 tablet by mouth daily.   Yes [provider]  Pediatric Multiple Vitamins (CHEWABLE MULTIPLE VITAMINS PO) Take 1 tablet by mouth daily.   Yes [provider]  VITAMIN E PO Take by mouth.   Yes [provider]  cyclobenzaprine (FLEXERIL) 10 MG tablet Take 1 tablet (10 mg total) by mouth 2 (two) times daily as needed for muscle spasms. Patient not taking: Reported on 04/29/2015 04/01/14   Harle Battiest, NP  metoprolol succinate (TOPROL-XL) 25 MG 24 hr tablet Take 0.5 tablets (12.5 mg total) by mouth daily. 09/06/18   Jacalyn Lefevre, MD  topiramate (TOPAMAX) 25 MG tablet Take 2 tablets (50 mg total) by mouth Nightly. Patient not taking: Reported on 09/06/2018 03/11/15   Levert Feinstein, MD    Family History Family History  Problem Relation Age of Onset  . Sinusitis Mother      Social History Social History   Tobacco Use  . Smoking status: Never Smoker  Substance Use Topics  . Alcohol use: No  . Drug use: No     Allergies   Patient has no known allergies.   Review of Systems Review of Systems  Cardiovascular: Positive for palpitations.  All other systems reviewed and are negative.    Physical Exam Updated Vital Signs BP 124/71   Pulse 87   Temp 98.6 F (37 C) (Oral)   Resp 12   Ht  (1.575 m)   Wt 46.3 kg   SpO2 100%   BMI 18.66 kg/m   Physical Exam Vitals signs and nursing note reviewed.  Constitutional:      Appearance: Normal appearance.  HENT:     Head: Normocephalic and atraumatic.     Right Ear: External ear normal.     Left Ear: External ear normal.     Nose: Nose normal.     Mouth/Throat:     Mouth: Mucous membranes are moist.     Pharynx: Oropharynx is clear.  Eyes:     Extraocular Movements: Extraocular movements intact.     Conjunctiva/sclera: Conjunctivae normal.     Pupils: Pupils are equal, round, and reactive to light.  Neck:     Musculoskeletal: Normal range of motion and neck supple.  Cardiovascular:     Rate and Rhythm: Regular rhythm. Tachycardia present.     Pulses: Normal pulses.     Heart sounds: Normal heart sounds.  Pulmonary:     Effort: Pulmonary effort is normal.     Breath sounds: Normal breath sounds.  Abdominal:     General: Abdomen is flat.  Musculoskeletal: Normal range of motion.  Skin:    General: Skin is warm.     Capillary Refill: Capillary refill takes less than 2 seconds.  Neurological:     General: No focal deficit present.     Mental Status: She is alert and oriented to person, place, and time.  Psychiatric:        Mood and Affect: Mood normal.        Behavior: Behavior normal.      ED Treatments / Results  Labs (all labs ordered are listed, but only abnormal results are displayed) Labs Reviewed  BASIC METABOLIC PANEL - Abnormal; Notable for the following components:       Result Value   Glucose, Bld 108 (*)    All other components within normal limits  CBC - Abnormal; Notable for the following components:   WBC 3.8 (*)    RBC 5.13 (*)    HCT 47.0 (*)    All other components within normal limits  TROPONIN I  TSH  I-STAT BETA HCG BLOOD, ED ([facility], [facility], [facility] ONLY)  I-STAT BETA HCG BLOOD, ED (NOT ORDERABLE)    EKG EKG Interpretation  Date/Time:  Wednesday September 06 2018 12:19:14 EDT Ventricular Rate:  108 PR Interval:    QRS Duration: 84 QT Interval:  324 QTC Calculation: 435 R Axis:   83 Text Interpretation:  Sinus tachycardia Low voltage, precordial leads Probable anteroseptal infarct, old similar to prior 10/15 Confirmed by Meridee Score [phone]) on 09/06/2018 12:56:24 PM   Radiology Dg Chest 2 View  Result Date: 09/06/2018 CLINICAL DATA:  Tachycardia EXAM: CHEST - 2 VIEW COMPARISON:  None. FINDINGS: The heart size and mediastinal contours are within normal limits. Both lungs are clear. The visualized skeletal structures are unremarkable. IMPRESSION: No acute abnormality of the lungs.  No focal airspace opacity. Electronically Signed   By: Lauralyn Primes M.D.   On: 09/06/2018 13:08    Procedures Procedures (including critical care time)  Medications Ordered in ED Medications  sodium chloride flush (NS) 0.9 % injection 3 mL (3 mLs Intravenous Not Given 09/06/18 1611)  metoprolol tartrate (LOPRESSOR) injection 5 mg (5 mg Intravenous Given 09/06/18 1626)     Initial Impression / Assessment and Plan / ED Course  I have reviewed the triage vital signs and the nursing notes.  Pertinent labs & imaging results that were available during my care of the patient were reviewed by me and considered in my medical decision making (see chart for details).       Pt's HR down to the 80s with 5 mg IV lopressor.  She feels much better.  She requests a rx for a cheaper beta blocker.  The pt is given a rx for toprol XL.  She knows to return if worse.  F/u with  pcp.  Final Clinical Impressions(s) / ED Diagnoses   Final diagnoses:  Sinus tachycardia    ED Discharge Orders         Ordered    metoprolol succinate (TOPROL-XL) 25 MG 24 hr tablet  Daily  09/06/18 1734           Jacalyn Lefevre, MD 09/06/18 1735

## 2018-09-06 NOTE — ED Triage Notes (Signed)
Pt states she got up this morning and was feeling dizzy. Pt states "I have a fast heart rate, but this feels quicker than normal". Pt also c/o dizziness. Pt states her heart rate is usually 115-120.

## 2020-10-14 IMAGING — CR CHEST - 2 VIEW
2 series · 2 of 2 positions shown · non-contrast
Comparison: None.

CLINICAL DATA: Tachycardia

EXAM:
CHEST - 2 VIEW

[w chest pa]
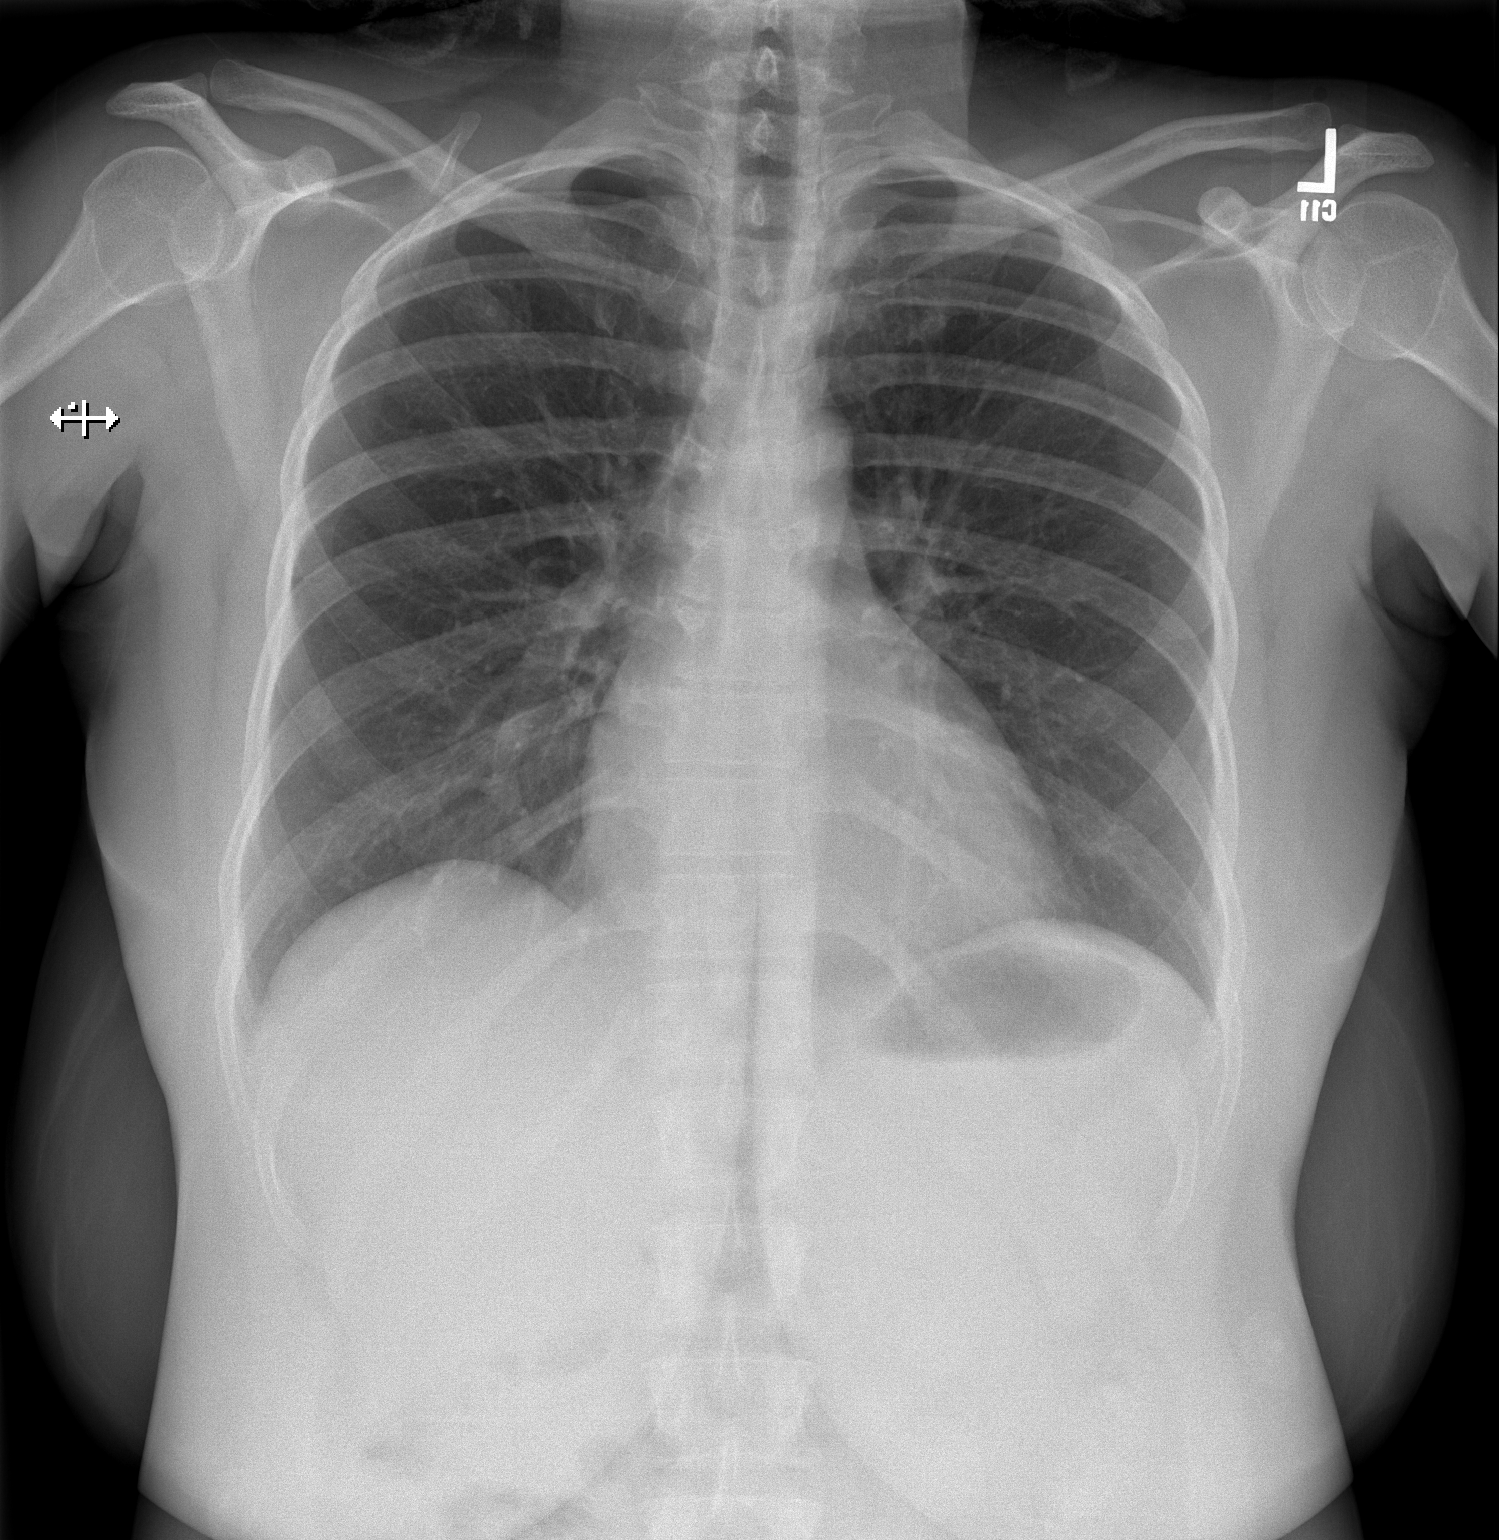

[w chest lat]
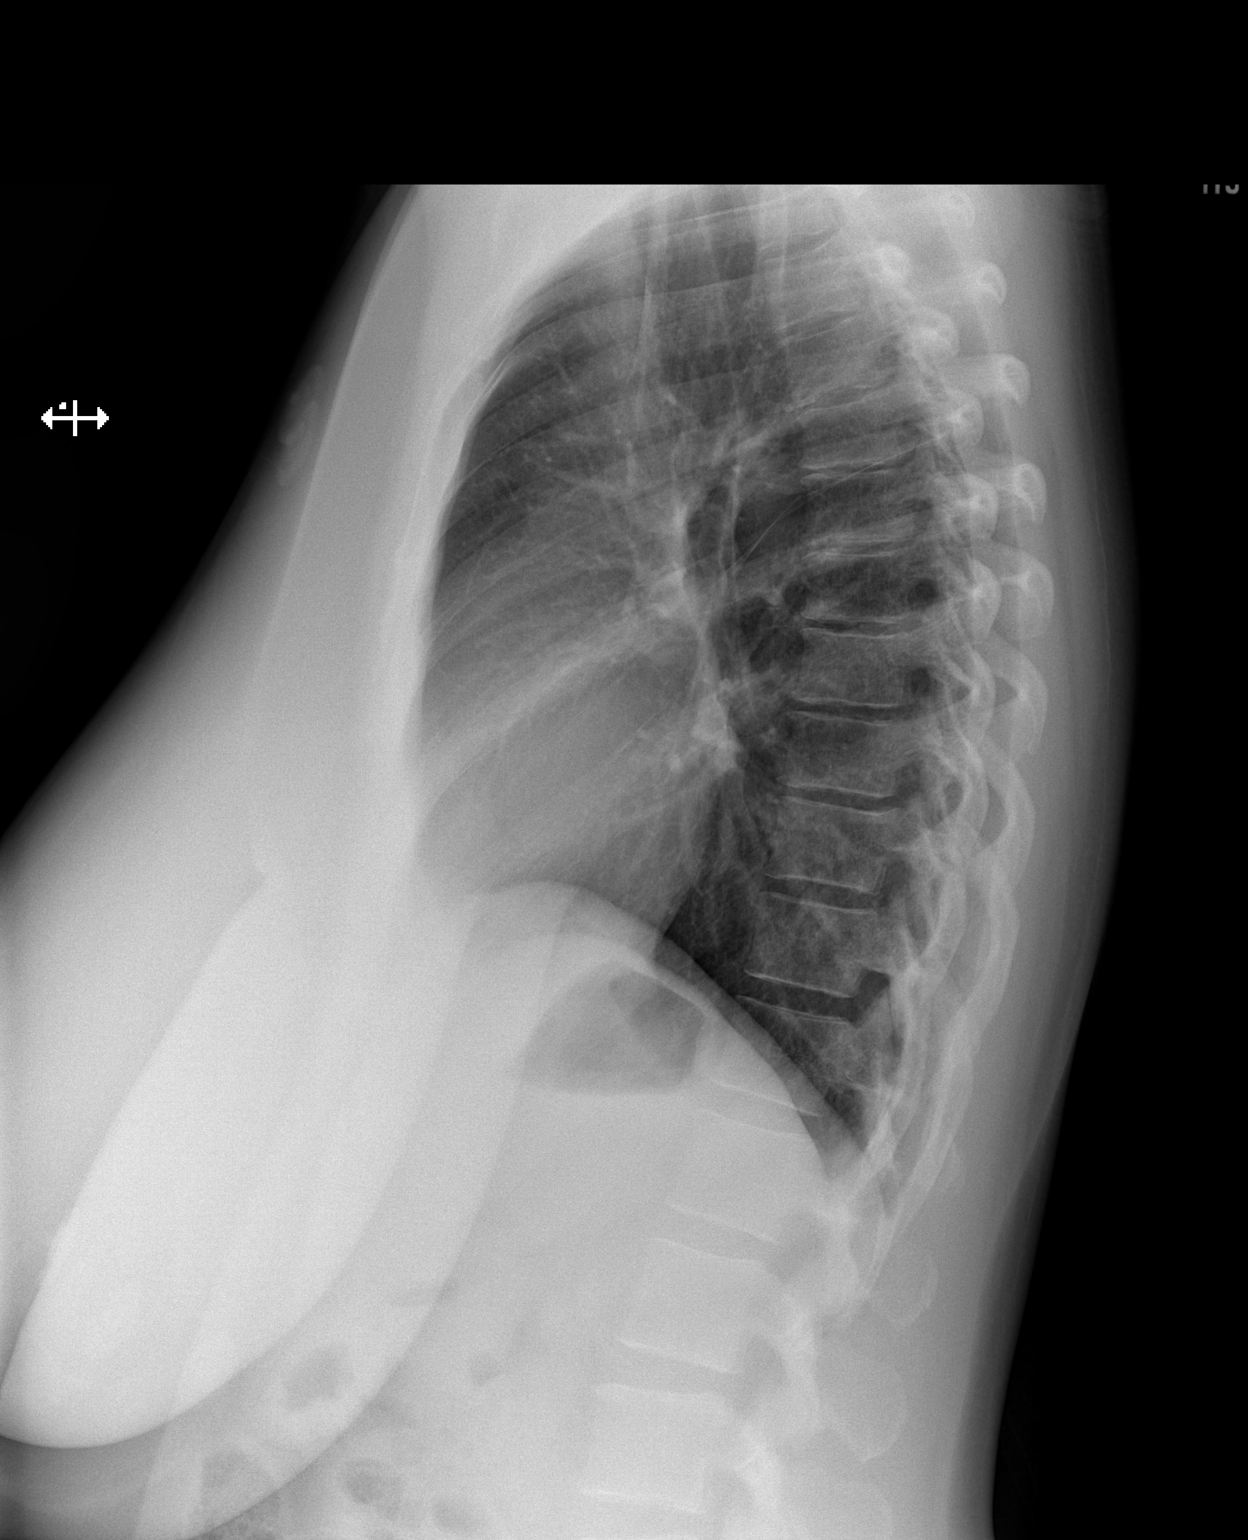

[2 of 2 positions shown; findings below may reference images not displayed]

FINDINGS: The heart size and mediastinal contours are within normal limits.
Both lungs are clear. The visualized skeletal structures are
unremarkable.
IMPRESSION: No acute abnormality of the lungs.  No focal airspace opacity.

## 2023-10-03 ENCOUNTER — Ambulatory Visit
Admission: EM | Admit: 2023-10-03 | Discharge: 2023-10-03 | Disposition: A | Discharge status: Home / Self Care | Attending: Family Medicine | Admitting: Family Medicine

## 2023-10-03 DIAGNOSIS — N3001 Acute cystitis with hematuria: Secondary | ICD-10-CM | POA: Diagnosis not present

## 2023-10-03 DIAGNOSIS — R300 Dysuria: Secondary | ICD-10-CM | POA: Diagnosis present

## 2023-10-03 LAB — POCT URINALYSIS DIP (MANUAL ENTRY)
Bilirubin, UA: NEGATIVE
Glucose, UA: NEGATIVE mg/dL
Nitrite, UA: NEGATIVE
Protein Ur, POC: 100 mg/dL — AB
Spec Grav, UA: 1.015
Urobilinogen, UA: 0.2 U/dL
pH, UA: 5.5

## 2023-10-03 MED ORDER — CEPHALEXIN 500 MG PO CAPS: 500.0000 mg | ORAL_CAPSULE | Freq: Two times a day (BID) | ORAL | 0 refills | Status: AC

## 2023-10-03 NOTE — Discharge Instructions (Addendum)

## 2023-10-03 NOTE — ED Provider Notes (Signed)
 [facility]  Note:  This document was prepared using Conservation officer, historic buildings and may include unintentional dictation errors.  MRN: [id number] DOB: 19-May-1976  Subjective:   Gina Arias is a 48 y.o. female presenting for 3-day history of dysuria, urinary urgency.  No fever, nausea, vomiting, vaginal discharge.  Patient admits that she does not hydrate very well.  She does drink fruit juice as well.  No GI symptoms.  No genital rashes.  No current facility-administered medications for this encounter.  Current Outpatient Medications:    Cholecalciferol (VITAMIN D PO), Take 1 tablet by mouth daily., Disp: , Rfl:    cyclobenzaprine (FLEXERIL) 10 MG tablet, Take 1 tablet (10 mg total) by mouth 2 (two) times daily as needed for muscle spasms. (Patient not taking: Reported on 04/29/2015), Disp: 20 tablet, Rfl: 0   metoprolol succinate (TOPROL-XL) 25 MG 24 hr tablet, Take 0.5 tablets (12.5 mg total) by mouth daily., Disp: 30 tablet, Rfl: 0   Pediatric Multiple Vitamins (CHEWABLE MULTIPLE VITAMINS PO), Take 1 tablet by mouth daily., Disp: , Rfl:    topiramate (TOPAMAX) 25 MG tablet, Take 2 tablets (50 mg total) by mouth Nightly. (Patient not taking: Reported on 09/06/2018), Disp: 60 tablet, Rfl: 0   VITAMIN E PO, Take by mouth., Disp: , Rfl:    No Known Allergies  Past Medical History:  Diagnosis Date   Migraines    Numbness and tingling    Tachycardia      Past Surgical History:  Procedure Laterality Date   ABDOMINAL HYSTERECTOMY      Family History  Problem Relation Age of Onset   Sinusitis Mother     Social History   Tobacco Use   Smoking status: Never  Substance Use Topics   Alcohol use: No   Drug use: No    ROS   Objective:   Vitals: BP (!) 158/83 (BP Location: Left Arm)   Pulse 64   Temp 98.8 F (37.1 C) (Oral)   Resp 18   SpO2 97%   Physical Exam Constitutional:      General: She is not in acute distress.     Appearance: Normal appearance. She is well-developed. She is not ill-appearing, toxic-appearing or diaphoretic.  HENT:     Head: Normocephalic and atraumatic.     Nose: Nose normal.     Mouth/Throat:     Mouth: Mucous membranes are moist.     Pharynx: Oropharynx is clear.  Eyes:     General: No scleral icterus.       Right eye: No discharge.        Left eye: No discharge.     Extraocular Movements: Extraocular movements intact.     Conjunctiva/sclera: Conjunctivae normal.  Cardiovascular:     Rate and Rhythm: Normal rate.  Pulmonary:     Effort: Pulmonary effort is normal.  Abdominal:     General: Bowel sounds are normal. There is no distension.     Palpations: Abdomen is soft. There is no mass.     Tenderness: There is no abdominal tenderness. There is no right CVA tenderness, left CVA tenderness, guarding or rebound.  Skin:    General: Skin is warm and dry.  Neurological:     General: No focal deficit present.     Mental Status: She is alert and oriented to person, place, and time.  Psychiatric:        Mood and Affect: Mood normal.  Behavior: Behavior normal.        Thought Content: Thought content normal.        Judgment: Judgment normal.     Results for orders placed or performed during the hospital encounter of 10/03/23 (from the past 24 hours)  POCT urinalysis dipstick     Status: Abnormal   Collection Time: 10/03/23 10:43 AM  Result Value Ref Range   Color, UA yellow    Clarity, UA clear    Glucose, UA negative mg/dL   Bilirubin, UA negative    Ketones, POC UA trace (5) (A) mg/dL   Spec Grav, UA 1.610    Blood, UA large (A)    pH, UA 5.5    Protein Ur, POC =100 (A) mg/dL   Urobilinogen, UA 0.2 E.U./dL   Nitrite, UA Negative    Leukocytes, UA Large (3+) (A)     Assessment and Plan :   PDMP not reviewed this encounter.  1. Acute cystitis with hematuria   2. Dysuria     Start cephalexin to cover for acute cystitis, urine culture pending.   Recommended aggressive hydration, limiting urinary irritants. Counseled patient on potential for adverse effects with medications prescribed/recommended today, ER and return-to-clinic precautions discussed, patient verbalized understanding.    Wallis Bamberg, [place] 10/03/23 1453

## 2023-10-03 NOTE — ED Triage Notes (Signed)
 Patient presents to office for Dysuria x 3 days. Denies any fever or other symptoms.

## 2023-10-05 LAB — URINE CULTURE: Culture: >=100000 — AB
